# Patient Record
Sex: Female | Born: 1995 | Race: Black or African American | Hispanic: No | Marital: Married | Smoking: Never smoker
Health system: Southern US, Community
[De-identification: ages and names within clinical notes are randomized; demographics above are authoritative.]

## PROBLEM LIST (undated history)

## (undated) DIAGNOSIS — J45909 Unspecified asthma, uncomplicated: Secondary | ICD-10-CM

---
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
## 2018-04-04 ENCOUNTER — Other Ambulatory Visit: Payer: Self-pay

## 2018-04-04 ENCOUNTER — Emergency Department (HOSPITAL_BASED_OUTPATIENT_CLINIC_OR_DEPARTMENT_OTHER)
Admission: EM | Admit: 2018-04-04 | Discharge: 2018-04-04 | Disposition: A | Discharge status: Home / Self Care | Payer: No Typology Code available for payment source | Attending: Emergency Medicine | Admitting: Emergency Medicine

## 2018-04-04 ENCOUNTER — Encounter (HOSPITAL_BASED_OUTPATIENT_CLINIC_OR_DEPARTMENT_OTHER): Payer: Self-pay | Admitting: Emergency Medicine

## 2018-04-04 DIAGNOSIS — Z79899 Other long term (current) drug therapy: Secondary | ICD-10-CM | POA: Insufficient documentation

## 2018-04-04 DIAGNOSIS — R112 Nausea with vomiting, unspecified: Secondary | ICD-10-CM | POA: Diagnosis present

## 2018-04-04 DIAGNOSIS — J45909 Unspecified asthma, uncomplicated: Secondary | ICD-10-CM | POA: Insufficient documentation

## 2018-04-04 HISTORY — DX: Unspecified asthma, uncomplicated: J45.909

## 2018-04-04 LAB — COMPREHENSIVE METABOLIC PANEL
ALT: 9 U/L (ref 0–44)
AST: 14 U/L — ABNORMAL LOW (ref 15–41)
Albumin: 4 g/dL (ref 3.5–5.0)
Alkaline Phosphatase: 39 U/L (ref 38–126)
Anion gap: 6 (ref 5–15)
BUN: 12 mg/dL (ref 6–20)
CO2: 23 mmol/L (ref 22–32)
Calcium: 8.8 mg/dL — ABNORMAL LOW (ref 8.9–10.3)
Chloride: 107 mmol/L (ref 98–111)
Creatinine, Ser: 0.79 mg/dL (ref 0.44–1.00)
GFR calc Af Amer: >60 mL/min (ref >60)
GFR calc non Af Amer: >60 mL/min (ref >60)
Glucose, Bld: 90 mg/dL (ref 70–99)
Potassium: 3.6 mmol/L (ref 3.5–5.1)
Sodium: 136 mmol/L (ref 135–145)
Total Bilirubin: 0.6 mg/dL (ref 0.3–1.2)
Total Protein: 6.7 g/dL (ref 6.5–8.1)

## 2018-04-04 LAB — CBC
HCT: 36.1 % (ref 36.0–46.0)
Hemoglobin: 10.6 g/dL — ABNORMAL LOW (ref 12.0–15.0)
MCH: 23.9 pg — ABNORMAL LOW (ref 26.0–34.0)
MCHC: 29.4 g/dL — ABNORMAL LOW (ref 30.0–36.0)
MCV: 81.3 fL (ref 80.0–100.0)
Platelets: 198 10*3/uL (ref 150–400)
RBC: 4.44 MIL/uL (ref 3.87–5.11)
RDW: 14.8 % (ref 11.5–15.5)
WBC: 7.2 10*3/uL (ref 4.0–10.5)
nRBC: 0 % (ref 0.0–0.2)

## 2018-04-04 LAB — URINALYSIS, ROUTINE W REFLEX MICROSCOPIC
Bilirubin Urine: NEGATIVE
Glucose, UA: NEGATIVE mg/dL
Hgb urine dipstick: NEGATIVE
Ketones, ur: 15 mg/dL — AB
Nitrite: NEGATIVE
Protein, ur: 30 mg/dL — AB
Specific Gravity, Urine: >1.03 — ABNORMAL HIGH (ref 1.005–1.030)
pH: 5.5 (ref 5.0–8.0)

## 2018-04-04 LAB — LIPASE, BLOOD: Lipase: 28 U/L (ref 11–51)

## 2018-04-04 LAB — URINALYSIS, MICROSCOPIC (REFLEX)

## 2018-04-04 LAB — PREGNANCY, URINE: Preg Test, Ur: NEGATIVE

## 2018-04-04 MED ORDER — ONDANSETRON 4 MG PO TBDP
4.0000 mg | ORAL_TABLET | Freq: Once | ORAL | Status: AC | PRN
  Administered 2018-04-04: 4 mg via ORAL
  Filled 2018-04-04: qty 1

## 2018-04-04 MED ORDER — ONDANSETRON HCL 4 MG/2ML IJ SOLN
4.0000 mg | Freq: Once | INTRAMUSCULAR | Status: AC
  Administered 2018-04-04: 4 mg via INTRAVENOUS
  Filled 2018-04-04: qty 2

## 2018-04-04 MED ORDER — ONDANSETRON HCL 4 MG PO TABS: 4.0000 mg | ORAL_TABLET | Freq: Four times a day (QID) | ORAL | 0 refills | Status: DC | PRN

## 2018-04-04 MED ORDER — SODIUM CHLORIDE 0.9 % IV BOLUS
1000.0000 mL | Freq: Once | INTRAVENOUS | Status: AC
  Administered 2018-04-04: 1000 mL via INTRAVENOUS

## 2018-04-04 MED ORDER — ONDANSETRON HCL 4 MG PO TABS: 4.0000 mg | ORAL_TABLET | Freq: Four times a day (QID) | ORAL | 0 refills | Status: AC | PRN

## 2018-04-04 NOTE — ED Triage Notes (Signed)
Pt states that on Friday started a new medication Metronidazole and has been having nausea, stomach pain, and vomiting since. Pt denies fevers.

## 2018-04-04 NOTE — ED Provider Notes (Signed)
[facility] EMERGENCY DEPARTMENT Provider Note   CSN: [id number] Arrival date & time: 04/04/18  7121     History   Chief Complaint Chief Complaint  Patient presents with  . Nausea  . Emesis    HPI Tracie Brown is a 23 y.o. female.  The history is provided by the patient.  Emesis    She has a history of asthma, and comes in complaining of nausea and vomiting.  She saw her gynecologist 2 days ago who started her on metronidazole for bacterial vaginosis, and also gave her fluconazole for monilial vaginitis.  Later that day, she developed nausea, and started vomiting last night.  She is has some abdominal soreness, but no true abdominal pain.  She denies fever or chills.  She denies constipation or diarrhea.  She denies any sick contacts.  She denies ethanol consumption.  Of note, she states she was not having any vaginal discharge or vaginal odor.  Past Medical History:  Diagnosis Date  . Asthma     Patient Active Problem List   Diagnosis Date Noted  . Allergy with anaphylaxis due to food 11/01/2015  . Allergic rhinitis due to pollen 11/01/2015  . Moderate persistent asthma 11/01/2015    History reviewed. No pertinent surgical history.   OB History   No obstetric history on file.      Home Medications    Prior to Admission medications   Medication Sig Start Date End Date Taking? Authorizing Provider  albuterol (PROAIR HFA) 108 (90 Base) MCG/ACT inhaler Inhale 2 puffs into the lungs every 6 (six) hours as needed for wheezing or shortness of breath.    [provider]  albuterol (VENTOLIN HFA) 108 (90 Base) MCG/ACT inhaler Inhale 2 puffs into the lungs every 4 (four) hours as needed for wheezing or shortness of breath. 11/01/15   Fletcher Anon, MD  beclomethasone (QVAR) 80 MCG/ACT inhaler Inhale 2 puffs into the lungs 2 (two) times daily. 11/01/15   Fletcher Anon, MD  ranitidine (ZANTAC) 150 MG tablet Take 1 tablet twice a day for 2 weeks  11/01/15   Fletcher Anon, MD    Family History No family history on file.  Social History Social History   Tobacco Use  . Smoking status: Never Smoker  . Smokeless tobacco: Never Used  Substance Use Topics  . Alcohol use: No  . Drug use: No     Allergies   Patient has no known allergies.   Review of Systems Review of Systems  Gastrointestinal: Positive for vomiting.  All other systems reviewed and are negative.    Physical Exam Updated Vital Signs BP (!) 122/59 (BP Location: Right Arm)   Pulse 67   Temp 98.2 F (36.8 C) (Oral)   Resp 20   Ht 5\' 4"  (1.626 m)   Wt 57.2 kg   LMP 03/25/2018   SpO2 95%   BMI 21.63 kg/m   Physical Exam Vitals signs and nursing note reviewed.    23 year old female, resting comfortably and in no acute distress. Vital signs are normal. Oxygen saturation is 95%, which is normal. Head is normocephalic and atraumatic. PERRLA, EOMI. Oropharynx is clear. Neck is nontender and supple without adenopathy or JVD. Back is nontender and there is no CVA tenderness. Lungs are clear without rales, wheezes, or rhonchi. Chest is nontender. Heart has regular rate and rhythm without murmur. Abdomen is soft, flat, nontender without masses or hepatosplenomegaly and peristalsis is hypoactive. Extremities have no  cyanosis or edema, full range of motion is present. Skin is warm and dry without rash. Neurologic: Mental status is normal, cranial nerves are intact, there are no motor or sensory deficits.  ED Treatments / Results  Labs (all labs ordered are listed, but only abnormal results are displayed) Labs Reviewed  URINALYSIS, ROUTINE W REFLEX MICROSCOPIC - Abnormal; Notable for the following components:      Result Value   APPearance CLOUDY (*)    Specific Gravity, Urine >1.030 (*)    Ketones, ur 15 (*)    Protein, ur 30 (*)    Leukocytes, UA SMALL (*)    All other components within normal limits  COMPREHENSIVE METABOLIC PANEL - Abnormal;  Notable for the following components:   Calcium 8.8 (*)    AST 14 (*)    All other components within normal limits  CBC - Abnormal; Notable for the following components:   Hemoglobin 10.6 (*)    MCH 23.9 (*)    MCHC 29.4 (*)    All other components within normal limits  URINALYSIS, MICROSCOPIC (REFLEX) - Abnormal; Notable for the following components:   Bacteria, UA MANY (*)    All other components within normal limits  PREGNANCY, URINE  LIPASE, BLOOD   Procedures Procedures   Medications Ordered in ED Medications  sodium chloride 0.9 % bolus 1,000 mL (1,000 mLs Intravenous New Bag/Given 04/04/18 0646)  ondansetron (ZOFRAN-ODT) disintegrating tablet 4 mg (4 mg Oral Given 04/04/18 0431)  ondansetron (ZOFRAN) injection 4 mg (4 mg Intravenous Given 04/04/18 0646)     Initial Impression / Assessment and Plan / ED Course  I have reviewed the triage vital signs and the nursing notes.  Pertinent lab results that were available during my care of the patient were reviewed by me and considered in my medical decision making (see chart for details).  Nausea and vomiting which are likely side effects of metronidazole, possible viral gastritis.  No red flags to suggest more serious pathology.  Old records are reviewed confirming recent visit with gynecologist and single dose fluconazole and 1 week course of metronidazole.  Labs are unremarkable.  Urinalysis does show high specific gravity and some ketones consistent with vomiting.  She will be given IV fluids and IV ondansetron.  No indication for abdominal imaging at this point.  Following above-noted treatment, she is feeling much better.  She is discharged with prescription for ondansetron, advised to discontinue metronidazole as she does not clinically have bacterial vaginosis.  Advised to stay on clear liquid diet, gradually advance as tolerated.  Final Clinical Impressions(s) / ED Diagnoses   Final diagnoses:  Non-intractable vomiting with  nausea, unspecified vomiting type    ED Discharge Orders         Ordered    ondansetron (ZOFRAN) 4 MG tablet  Every 6 hours PRN,   Status:  Discontinued     04/04/18 0733    ondansetron (ZOFRAN) 4 MG tablet  Every 6 hours PRN     04/04/18 0741           Dione Booze, MD 04/04/18 [phone]

## 2018-04-04 NOTE — Discharge Instructions (Addendum)
Stop taking metronidazole.  Start with clear liquids, and gradually advance your diet as tolerated.
